# Patient Record
Sex: Male | Born: 1995 | Race: White | Hispanic: No | Marital: Single | Smoking: Never smoker
Health system: Southern US, Community
[De-identification: ages and names within clinical notes are randomized; demographics above are authoritative.]

## PROBLEM LIST (undated history)

## (undated) DIAGNOSIS — F419 Anxiety disorder, unspecified: Secondary | ICD-10-CM

---
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
## 2017-07-03 IMAGING — CR DG FOOT COMPLETE 3+V*R*
1 series · 3 of 3 positions shown · non-contrast
Comparison: None.

CLINICAL DATA: Ankle injury

EXAM:
RIGHT FOOT COMPLETE - 3+ VIEW

[Series 1: ap · 0.17mm/px · 3 of 3 slices shown]
[im 1/3]
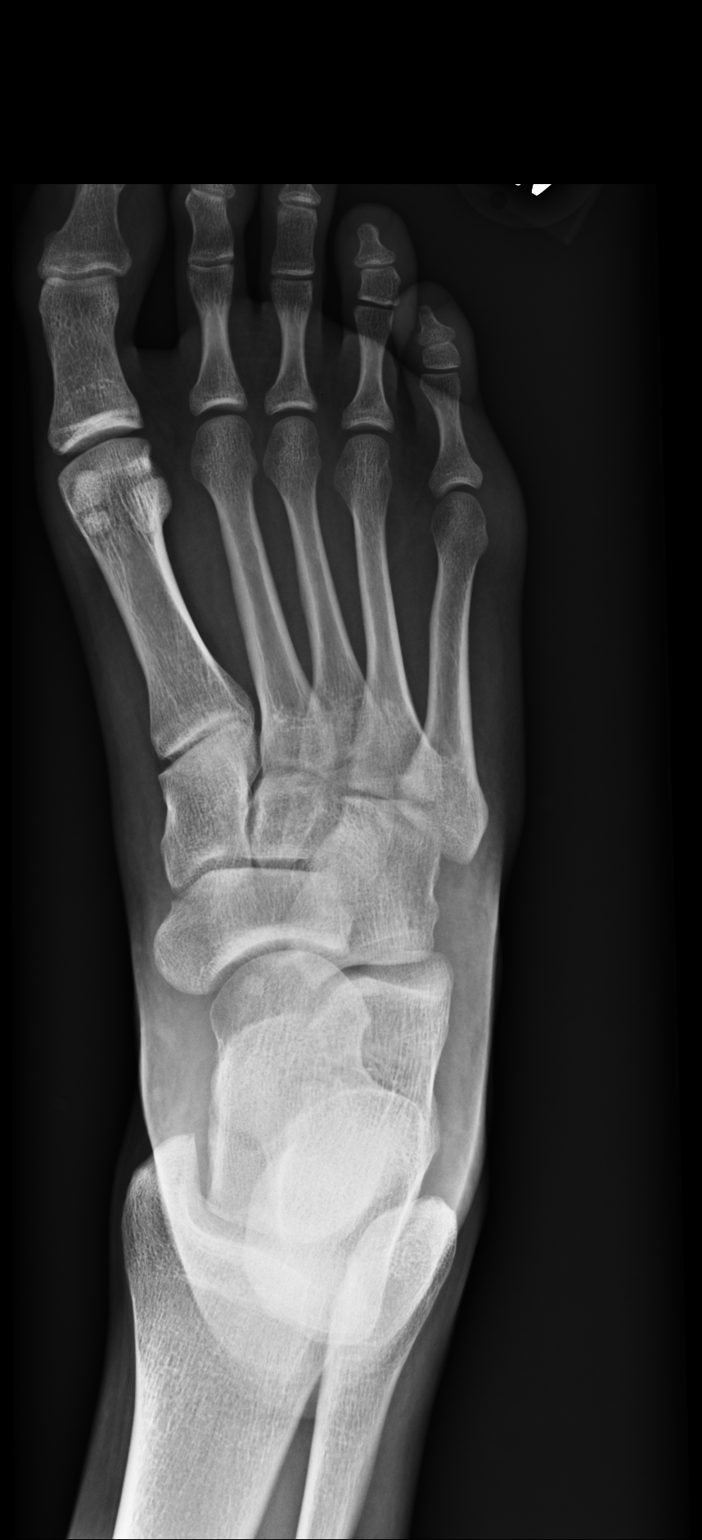
[im 2/3]
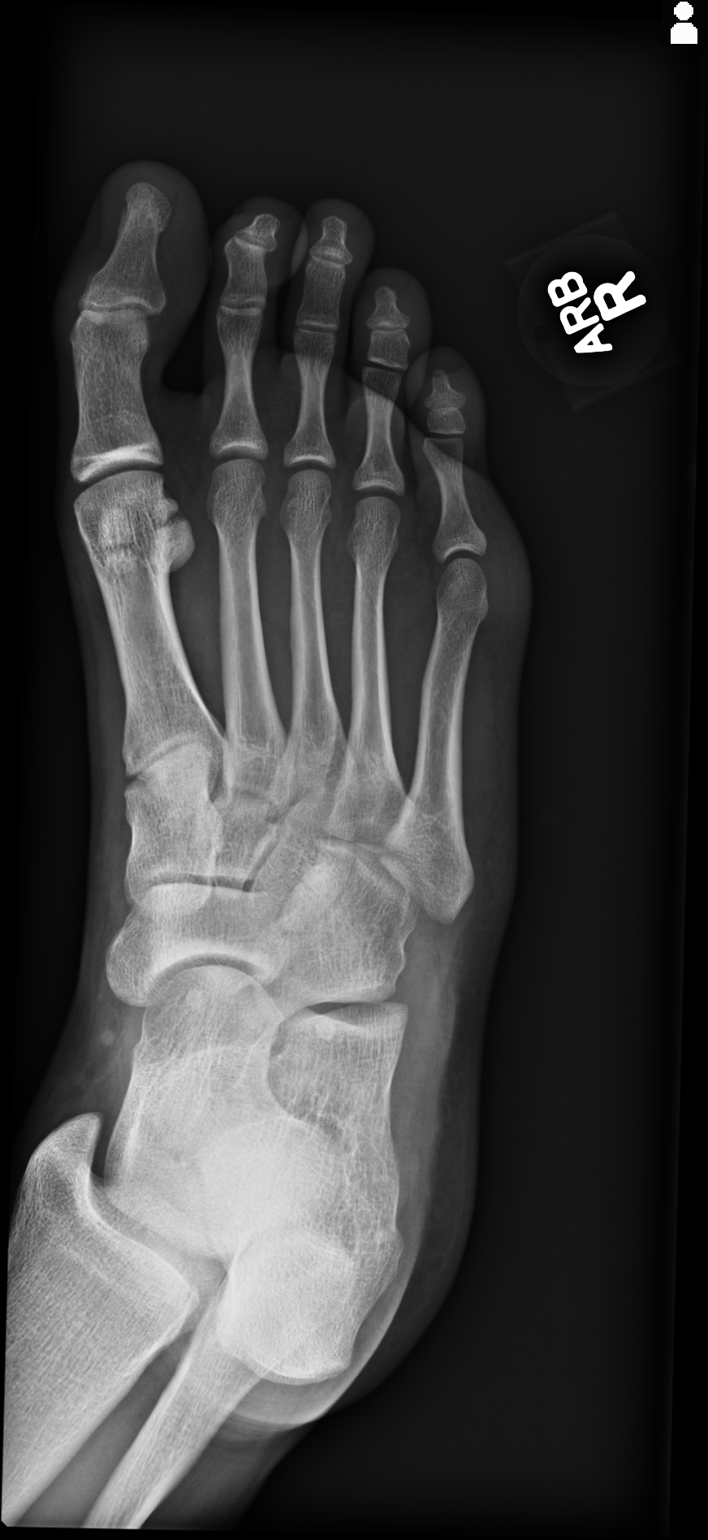
[im 3/3]
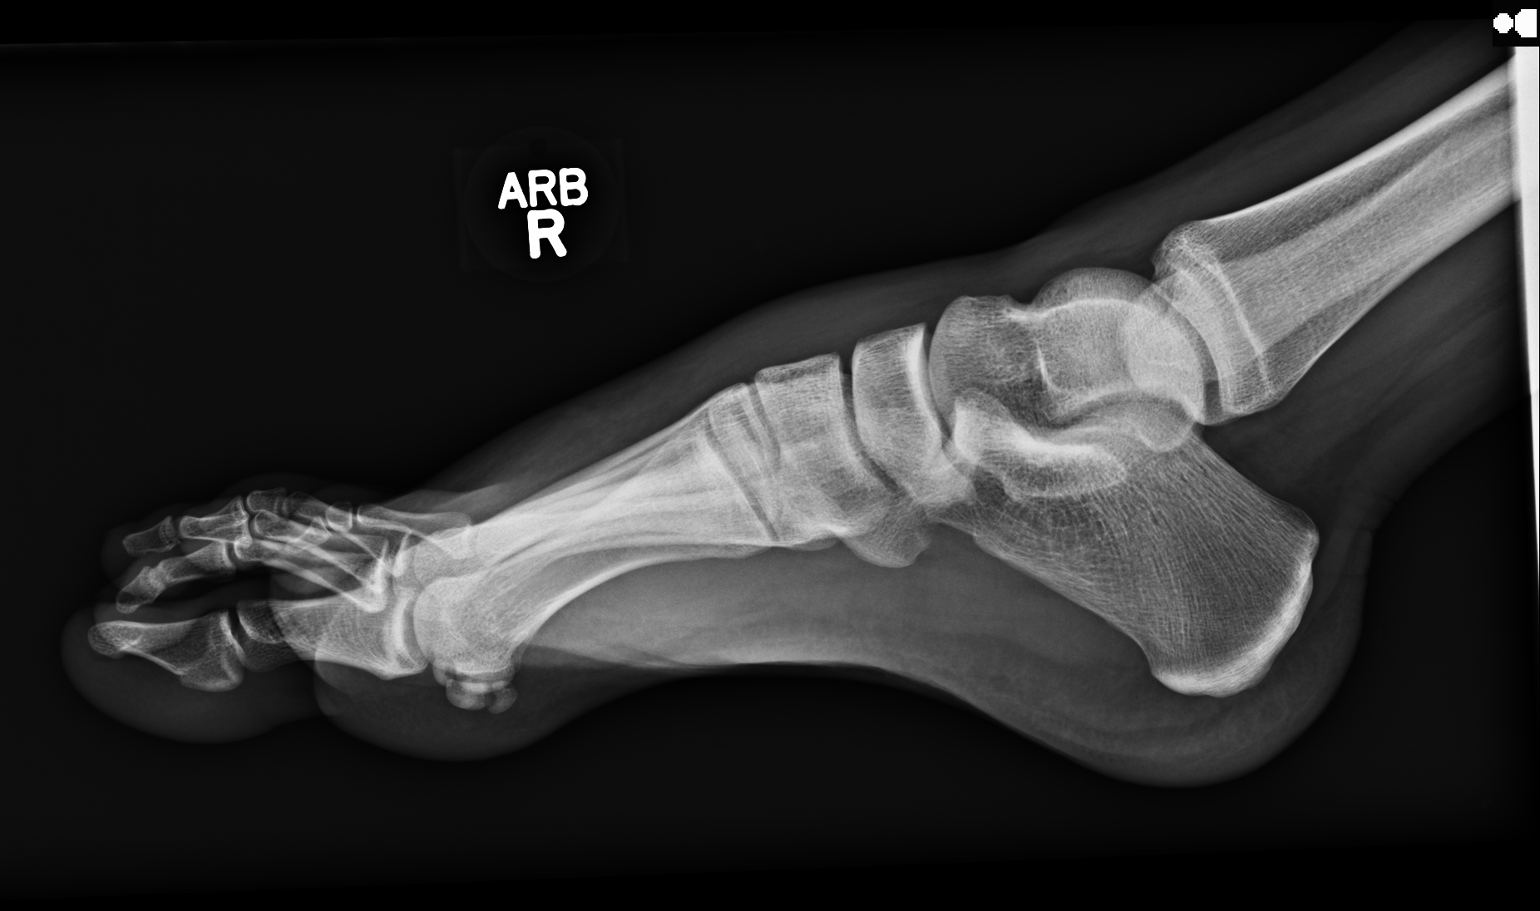

[3 of 3 positions shown; findings below may reference images not displayed]

FINDINGS: No fracture or dislocation is seen.

The joint spaces are preserved.

Mild dorsal soft tissue swelling.
IMPRESSION: No fracture or dislocation is seen.

Mild dorsal soft tissue swelling.
